# Patient Record
Sex: Male | Born: 1940 | Race: White | Hispanic: No | Marital: Married
Health system: Midwestern US, Academic
[De-identification: ages and names within clinical notes are randomized; demographics above are authoritative.]

---

## 2022-02-22 ENCOUNTER — Encounter: Admit: 2022-02-22 | Discharge: 2022-02-22 | Payer: MEDICARE

## 2022-02-22 ENCOUNTER — Ambulatory Visit: Admit: 2022-02-22 | Discharge: 2022-02-22 | Payer: MEDICARE

## 2022-02-22 DIAGNOSIS — R011 Cardiac murmur, unspecified: Secondary | ICD-10-CM

## 2022-02-23 ENCOUNTER — Encounter: Admit: 2022-02-23 | Discharge: 2022-02-23 | Payer: MEDICARE

## 2022-02-24 ENCOUNTER — Encounter: Admit: 2022-02-24 | Discharge: 2022-02-24 | Payer: MEDICARE

## 2022-02-24 NOTE — Telephone Encounter
02-24-2022 Per Task Message, request faxed to Dr Erskine Emery,   (F) [phone], clp      PCP, Dr. Barrie Folk, [facility], [address] [place], [place] [address], [phone]

## 2022-03-04 ENCOUNTER — Encounter: Admit: 2022-03-04 | Discharge: 2022-03-04 | Payer: MEDICARE

## 2022-03-04 DIAGNOSIS — R011 Cardiac murmur, unspecified: Secondary | ICD-10-CM

## 2022-03-10 ENCOUNTER — Ambulatory Visit: Admit: 2022-03-10 | Discharge: 2022-03-11 | Payer: MEDICARE

## 2022-03-10 ENCOUNTER — Encounter: Admit: 2022-03-10 | Discharge: 2022-03-10 | Payer: MEDICARE

## 2022-03-10 DIAGNOSIS — R55 Syncope and collapse: Secondary | ICD-10-CM

## 2022-03-10 DIAGNOSIS — E1121 Type 2 diabetes mellitus with diabetic nephropathy: Secondary | ICD-10-CM

## 2022-03-10 DIAGNOSIS — R011 Cardiac murmur, unspecified: Secondary | ICD-10-CM

## 2022-03-10 DIAGNOSIS — I359 Nonrheumatic aortic valve disorder, unspecified: Secondary | ICD-10-CM

## 2022-03-10 MED ORDER — HYDROCHLOROTHIAZIDE 25 MG PO TAB
25 mg | ORAL_TABLET | Freq: Every day | ORAL | 3 refills | 28.00000 days | Status: AC
Start: 2022-03-10 — End: ?

## 2022-03-10 NOTE — Progress Notes
Date of Service: 03/10/2022    Eric Barnes is a 81 y.o. male.       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  He is not having chest pain, palpitations, change in activity tolerance or shortness of breath.  Reports a few months ago was sitting down and got up to answer the phone fairly quickly and slouch against the door and went down.  By the time his wife got to him he was already awake and she helped him up without further sequela.  Has not reoccurred.  Really did not have any warning and does not recall much of the event.  Was fully neurologically intact both prior and after the event.  He was hospitalized in early June of this year because of cough that was found to be pneumonia.  He was hospitalized for 2 days without complications of recovery.  And continues to do well.  Notes he has gained a few pounds but denies orthopnea or PND type symptoms.  Has not had any recurrent syncope or near syncopal type symptoms since that time.  Denies significant neuropathy or balance difficulties.  Family history is positive for diabetes mellitus in multiple members.  His mother died at 89 and had been a significant diabetic.  His father lived to the age of 78.  Has a brother who is passed away but has a sister is still living.  Echocardiogram was performed that showed preserved left ventricular ejection fraction with no significant right heart abnormalities or pulmonary hypertension that was identified.  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  The study notes that the aortic valve was poorly visualized and there were concerns that may be pressure and velocity measurements were suboptimal.         Vitals: 03/10/22 0837   BP: 130/76   BP Source: Arm, Left Upper   Pulse: 73   SpO2: 96%   O2 Device: None (Room air)   PainSc: Zero   Weight: 93.6 kg (206 lb 6.4 oz)   Height: 175 cm (5' 8.9)     Body mass index is 30.57 kg/m?Marland Kitchen     Past Medical History  Patient Active Problem List    Diagnosis Date Noted   ? CKD (chronic kidney disease) stage 3, GFR 30-59 ml/min (HCC) 03/04/2022   ? Diabetes type 2, controlled (HCC) 03/04/2022   ? Obesity 03/04/2022   ? Heart murmur 03/04/2022   ? Hypertension 09/21/2011   ? Hyperlipidemia 09/21/2011         Review of Systems   Constitutional: Negative.   HENT: Negative.    Eyes: Negative.    Cardiovascular: Positive for syncope.   Respiratory: Negative.    Endocrine: Negative.    Hematologic/Lymphatic: Negative.    Skin: Negative.    Musculoskeletal: Negative.    Gastrointestinal: Negative.    Genitourinary: Negative.    Psychiatric/Behavioral: Negative.    Allergic/Immunologic: Negative.        Physical Exam  Awake and alert, well-nourished elderly man.  In no distress.  Accompanied by his wife.  Pupils are equal and reactive without scleral injection  Neck is supple with normal carotid upstroke and no  bruits.  I have a faint appreciation of radiation of his murmur into his neck.  Jugular venous distention set about 10 cm is fairly persistent with and without hepatic compression  Chest is symmetric and lungs are clear to auscultation with good effort no focal crackles or wheezes  Heart S1, S2 that are both preserved he does have an early systolic murmur 3/6 there is heard pretty much throughout the precordium and fairly stable with Valsalva and hand grasp maneuvers  Abdomen is protuberant but otherwise soft and nontender with positive bowel sounds and no bruits  Did not appears organomegaly but is a tough examination  Pulses are 2+, regular, and symmetric bilaterally radial as well as pedal location  Minimal edema just at the ankles with no obvious skin abnormalities.  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   02/22/22 30.04 kg/m?      Smoking Social History     Tobacco Use   Smoking Status Never   Smokeless Tobacco Current   ? Types: Chew      Lipid Profile Cholesterol   Date Value Ref Range Status   02/17/2022 210 (H) <200 Final     HDL   Date Value Ref Range Status   02/17/2022 38 (L) >=40 Final     LDL   Date Value Ref Range Status   02/17/2022 125 (H) <100 Final     Triglycerides   Date Value Ref Range Status   02/17/2022 237 (H) <150 Final      Blood Sugar No results found for: HGBA1C  Glucose   Date Value Ref Range Status   02/17/2022 133 (H) 70 - 105 Final   02/07/2022 235 (H) 70 - 105 Final          Problems Addressed Today  Encounter Diagnoses   Name Primary?   ? Aortic valve disorder Yes   ? Syncope, unspecified syncope type    ? Type II diabetes mellitus with nephropathy (HCC)        Assessment and Plan     Syncope was multifactorial.  With recognition of the sclerotic changes to his aortic valve with the history of hypertension, diabetes, and his age he likely has a fair amount of atherosclerotic changes with his vascular beds.  Was somewhat of a orthostatic type phenomenon but nothing that is problematic.  Has been backed away from some of his blood pressure medicine since his hospitalization because of lower blood pressure.  Renal function electrolytes have otherwise been stable.  Examination today does show hypervolemia and his blood pressure stable.  I am going to reinitiate his hydrochlorothiazide at 25 mg p.o. daily.  He can contact my office any question concerns otherwise follow-up with his  primary physician regarding response from a hemodynamic standpoint as well as his renal status.  Continue with the rosuvastatin and other risk factor modification for his risk for cardiovascular events going forward.  Based upon his clinical history, exam, and data available.  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  At this time would not perform further testing and just reassess routinely in approximately 3 years by echocardiogram.  If he is having change in symptoms or findings would reassess sooner.  Does discuss with me that he is having issues with constipation.  Discussed using skin fruits as well as over-the-counter MiraLAX or combination docusate/senna.  Noting that usually he requires a higher doses and titrated down to get good early control.  Follow-up otherwise can be as needed with me and routinely with his primary physician.         Current Medications (including today's revisions)  ? aspirin EC 81 mg tablet Take one tablet by mouth daily.   ? canagliflozin (INVOKANA) 100 mg tablet Take one tablet by mouth daily with breakfast.   ? hydroCHLOROthiazide (HYDRODIURIL) 25 mg tablet Take one tablet by mouth daily.   ? losartan (COZAAR) 100 mg tablet Take one tablet by mouth daily.   ? LUMIGAN 0.01 % drop Apply one drop to both eyes at bedtime daily.   ? metFORMIN-XR (GLUCOPHAGE XR) 500 mg extended release tablet Take three tablets by mouth daily.   ? pioglitazone (ACTOS) 15 mg tablet Take one tablet by mouth daily.   ? rosuvastatin (CRESTOR) 10 mg tablet Take one tablet by mouth daily.   ? timoloL maleate (TIMOPTIC) 0.5 % ophthalmic drops Apply one drop to two drops to both eyes every morning.   ? timolol maleate XE (TIMOPTIC-XE) 0.5 % ophthalmic gel Apply one drop to right eye as directed daily.

## 2023-03-09 ENCOUNTER — Encounter: Admit: 2023-03-09 | Discharge: 2023-03-09 | Payer: MEDICARE

## 2023-03-09 MED ORDER — HYDROCHLOROTHIAZIDE 25 MG PO TAB
25 mg | ORAL_TABLET | Freq: Every day | ORAL | 3 refills
Start: 2023-03-09 — End: ?

## 2023-03-10 ENCOUNTER — Encounter: Admit: 2023-03-10 | Discharge: 2023-03-10 | Payer: MEDICARE

## 2023-04-01 ENCOUNTER — Encounter: Admit: 2023-04-01 | Discharge: 2023-04-01 | Payer: MEDICARE

## 2023-04-01 MED ORDER — HYDROCHLOROTHIAZIDE 25 MG PO TAB
25 mg | ORAL_TABLET | Freq: Every day | ORAL | 1 refills
Start: 2023-04-01 — End: ?

## 2023-05-26 IMAGING — CT C_SPINE_(Adult)
3 of 5 series · 11 of 33 positions shown, 13 images · non-contrast
Comparison: none

[Series 4: c-spine cor 2.00 br60 s3 · coronal · 0.25mm/px · 3 of 66 slices shown]
[im 14/66  bone]
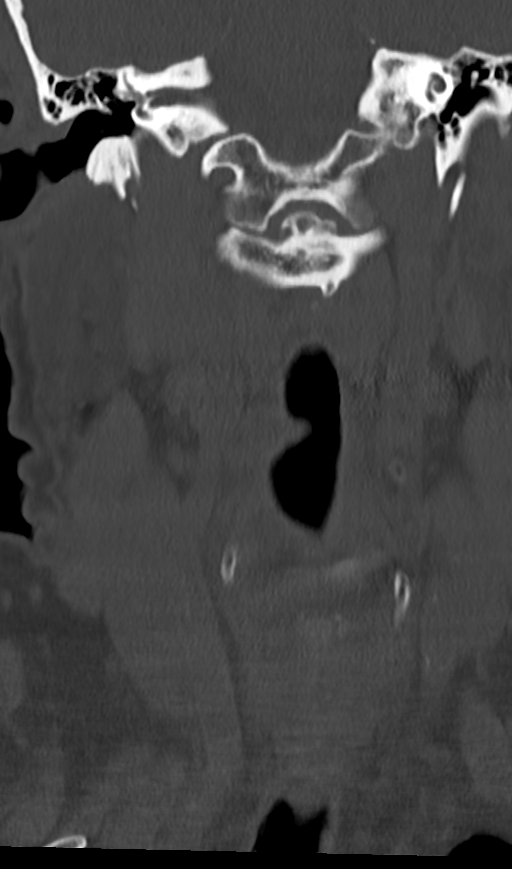
[im 27/66  bone]
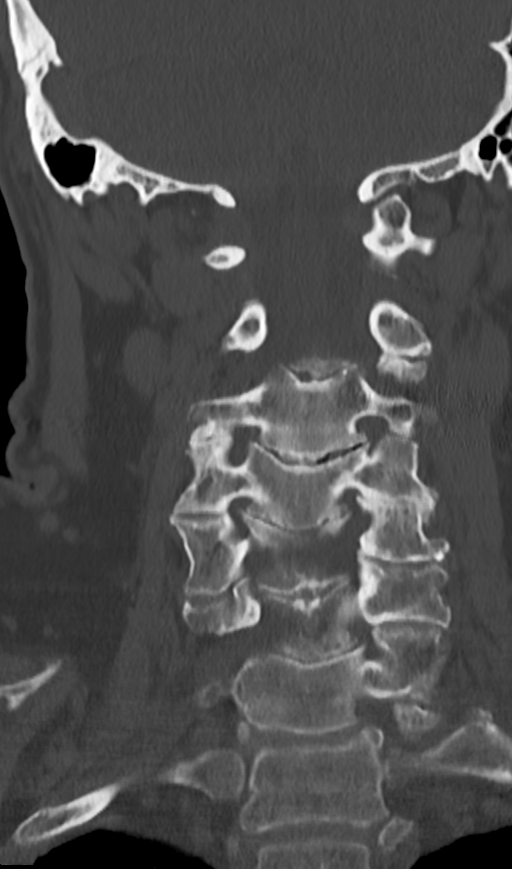
[im 40/66  bone]
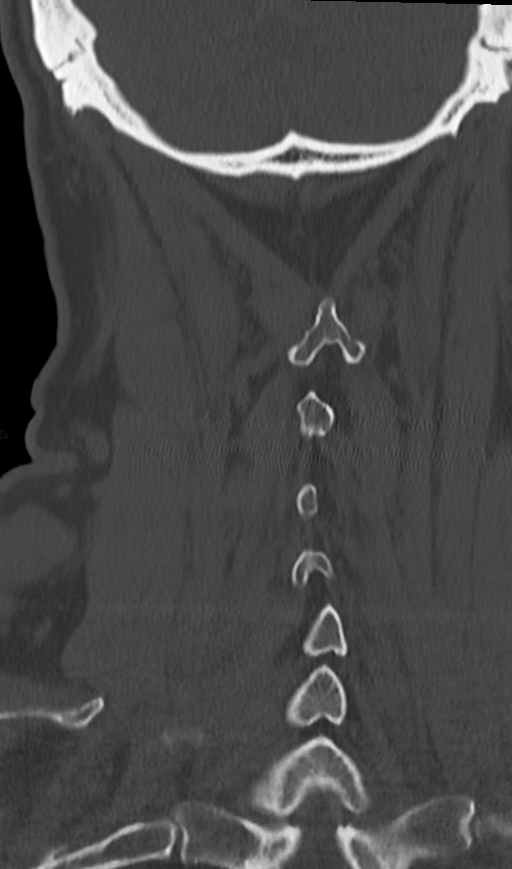

[Series 6: c-spine sag 2.00 br60 s3 · sagittal · 0.27mm/px · 5 of 64 slices shown, 6 images]
[im 22/64  bone]
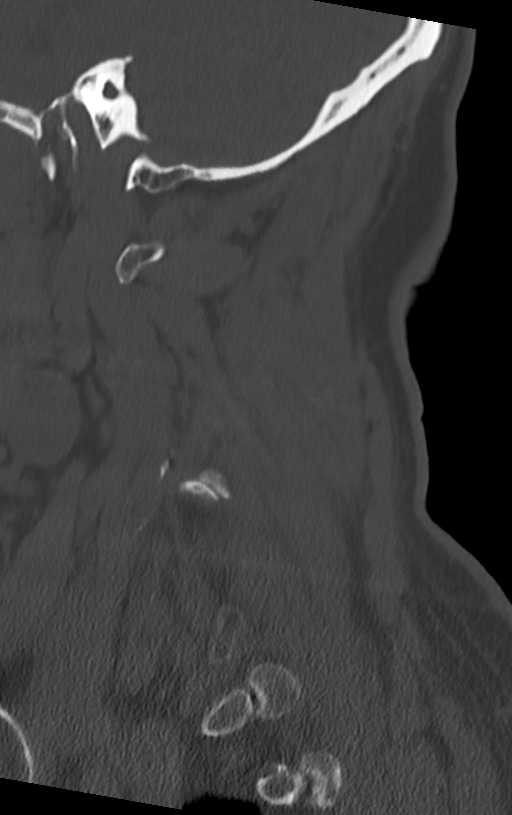
[im 27/64  bone]
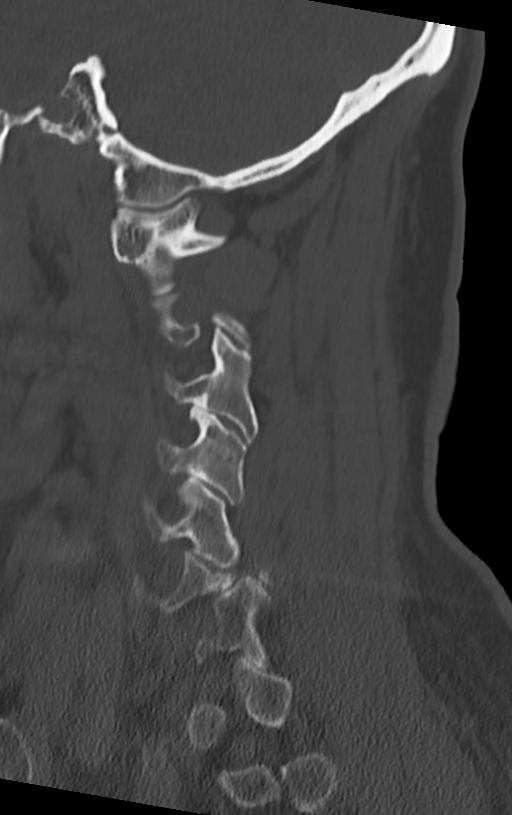
[im 32/64  soft-tissue]
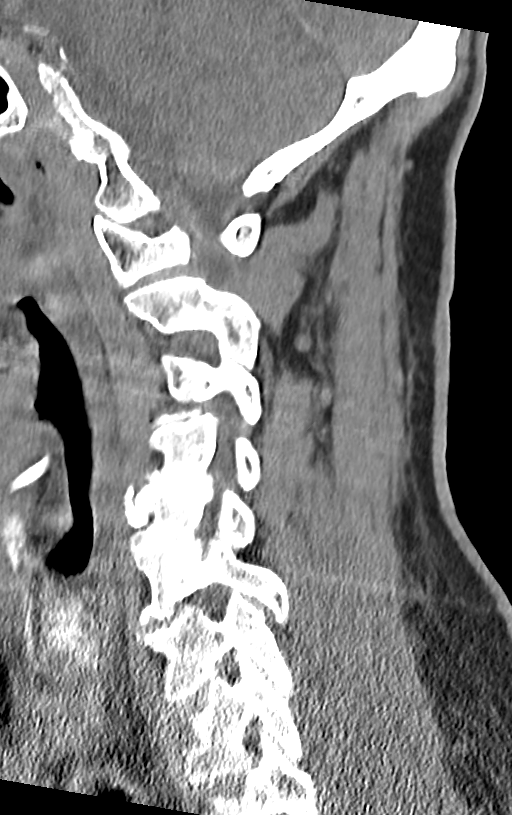
[im 32/64  bone]
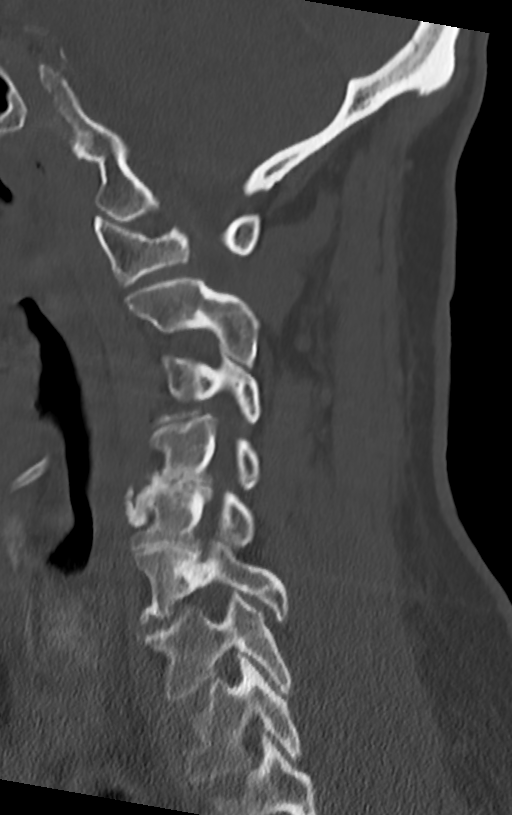
[im 37/64  bone]
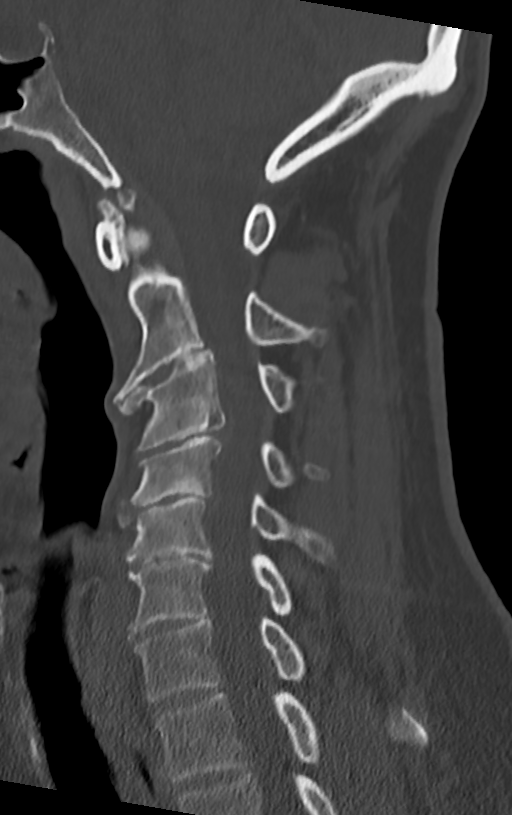
[im 43/64  bone]
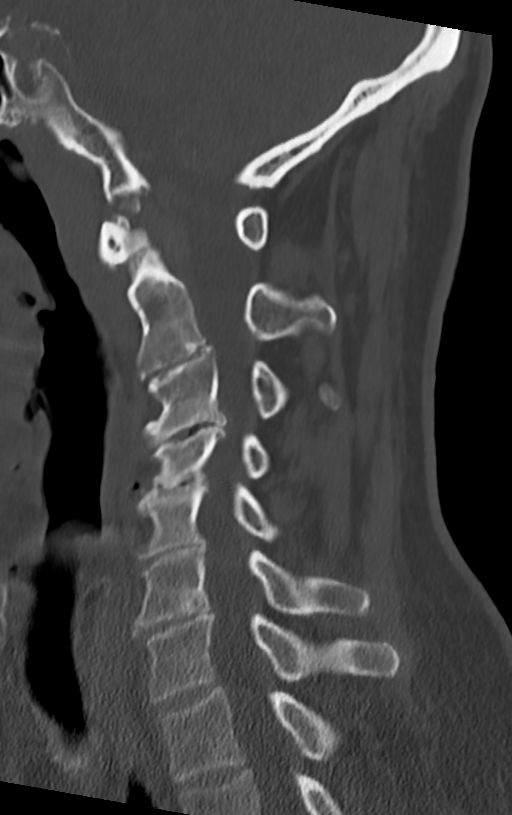

[Series 11: c-spine ax 1.00 br40 s3 · axial · 0.28mm/px · z∈[-755,-645]mm · 3 of 165 slices shown, 4 images]
[im 33/165  soft-tissue]
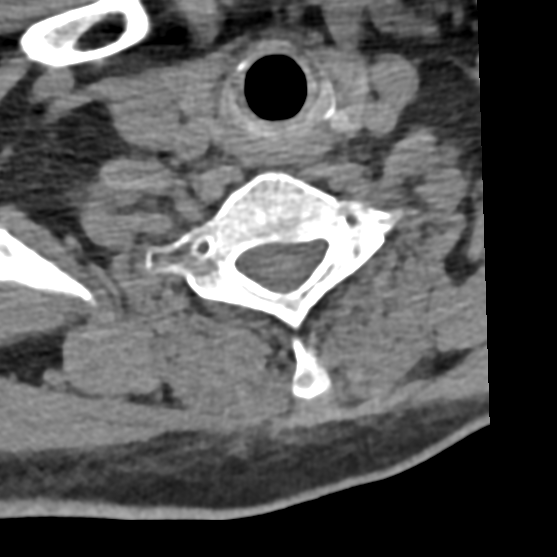
[im 33/165  bone]
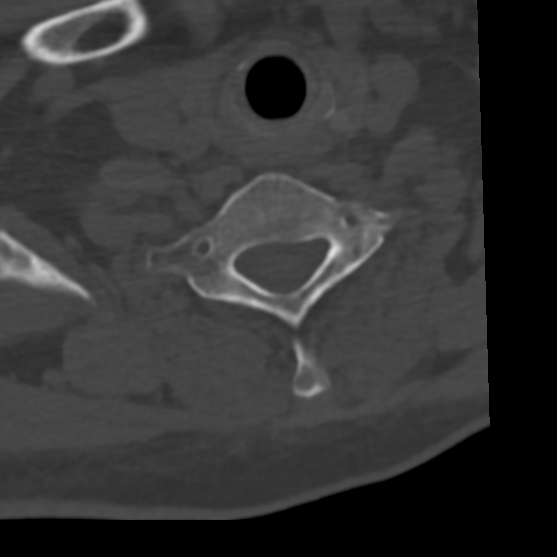
[im 99/165  bone]
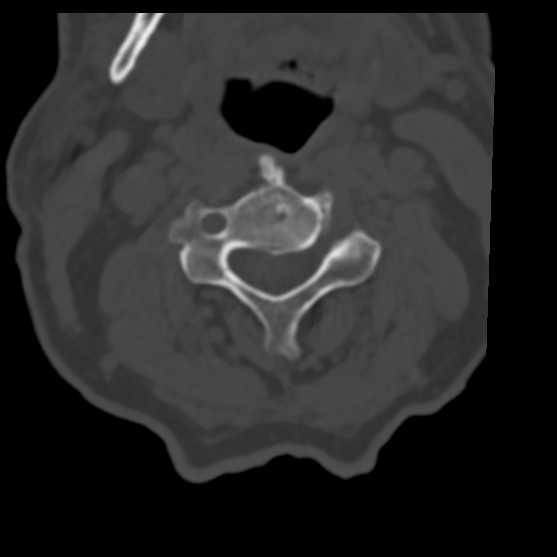
[im 132/165  bone]
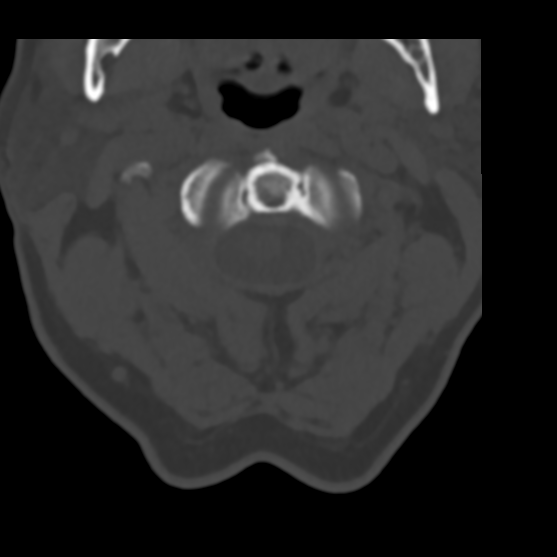

[11 of 33 positions shown; findings below may reference images not displayed]

EXAM

CT cervical spine wo con

INDICATION

fall, hit meds, now with neck pain
Neck pain after falling and hitting his head. CT/NM 0/0

TECHNIQUE

CT CERVICAL SPINE WITHOUT CONTRAST. Volumetric multi detector CT images of the chest were obtained
after the uneventful administration of [100] mL of Omnipaque 300 intravenous contrast. All CT scans
at this facility use dose modulation, iterative reconstruction, and/or weight based dosing when
appropriate to reduce radiation dose to as low as reasonably achievable.

# of previous computed tomography exams in the last 12 months = 0 # of previous myocardial perfusion
studies in the last 12 months = 0

COMPARISONS

None available at the time of dictation.

FINDINGS

There is no significant paravertebral soft tissue swelling. There are degenerative changes
throughout the cervical spine. There is no evidence of acute fracture or dislocation.

IMPRESSION
1. No acute traumatic findings.

Tech Notes:

Neck pain after falling and hitting his head. CT/NM 0/0

## 2023-06-10 IMAGING — US [ID]
1 series · 12 of 16 positions shown · non-contrast
Comparison: none

[Series 1: us echo 2d limited · 101 acquisitions, 12 frames shown]
[im 1/101]
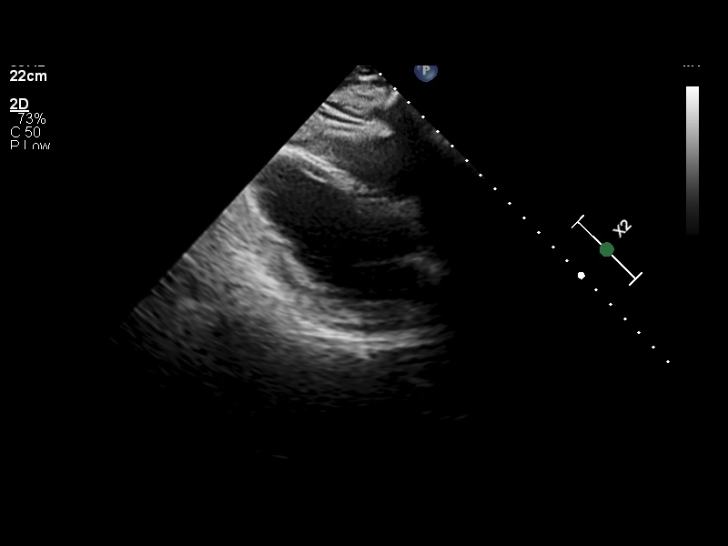
[im 14/101]
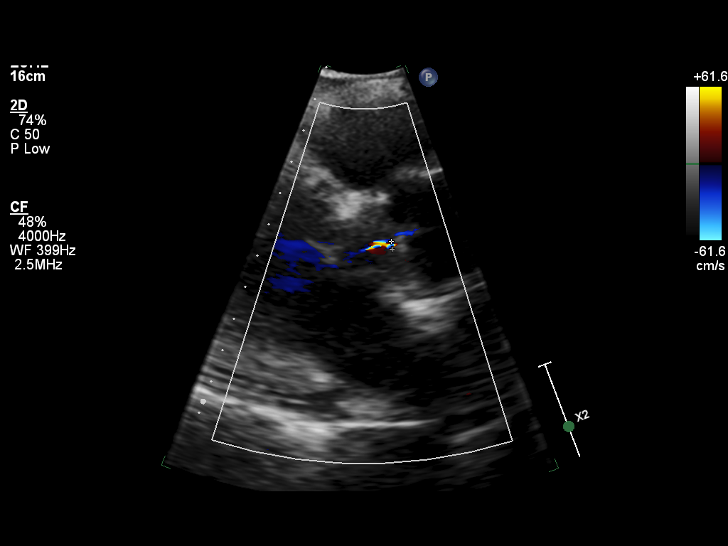
[im 21/101]
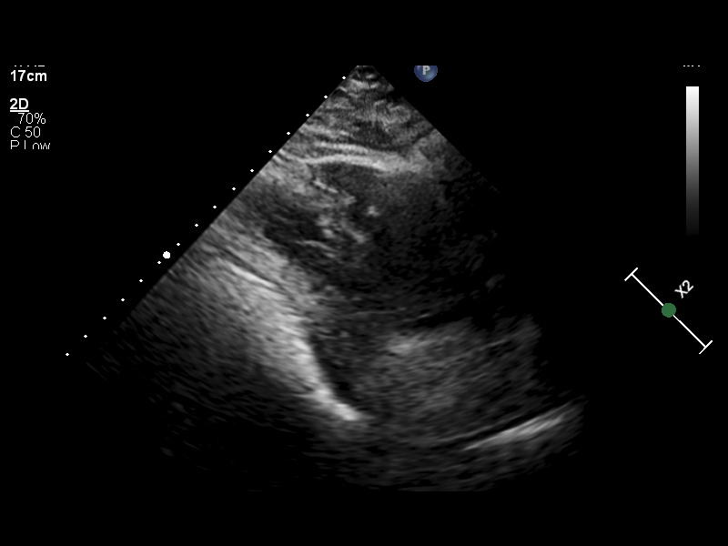
[im 27/101]
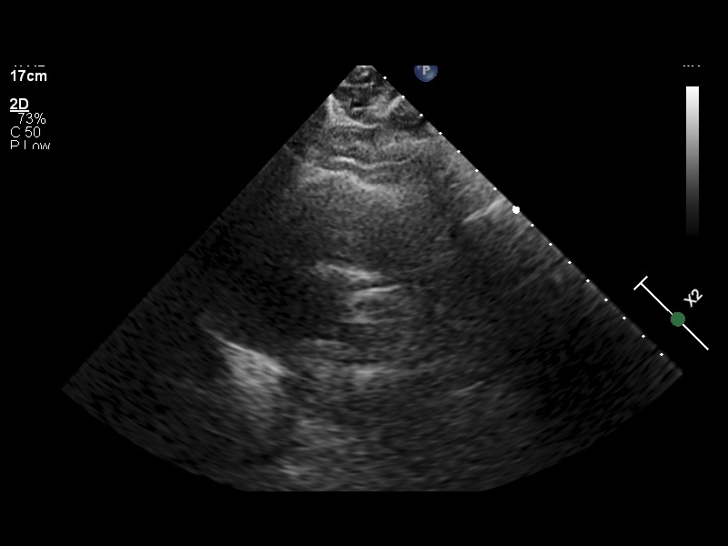
[im 41/101]
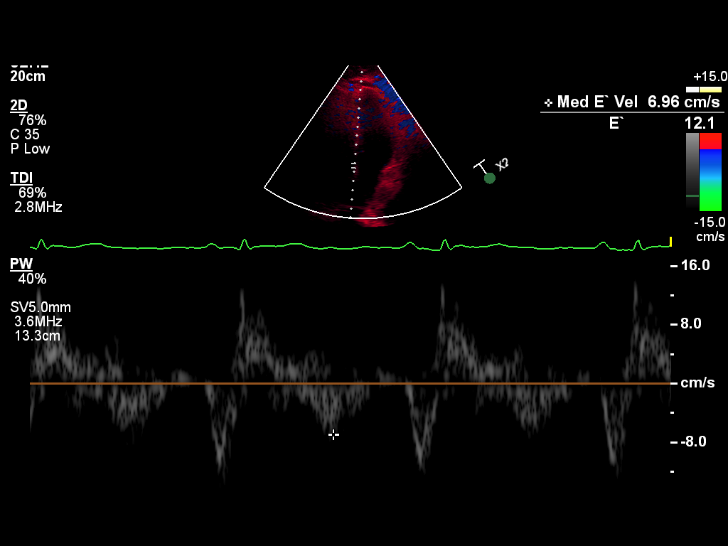
[im 47/101]
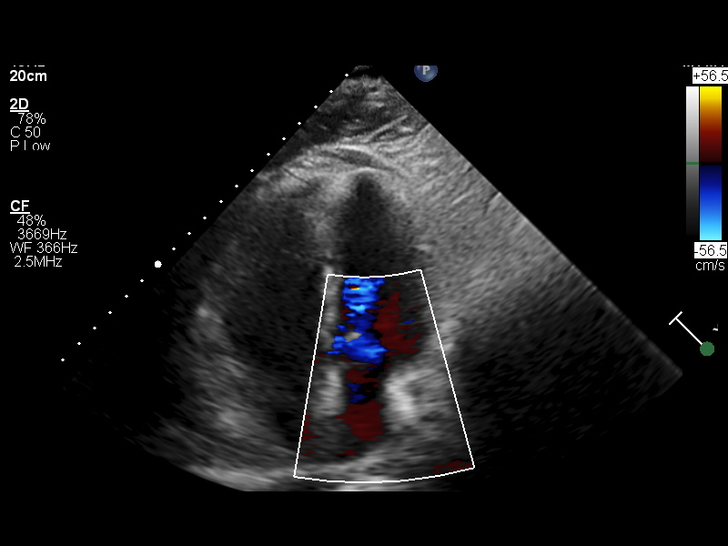
[im 54/101]
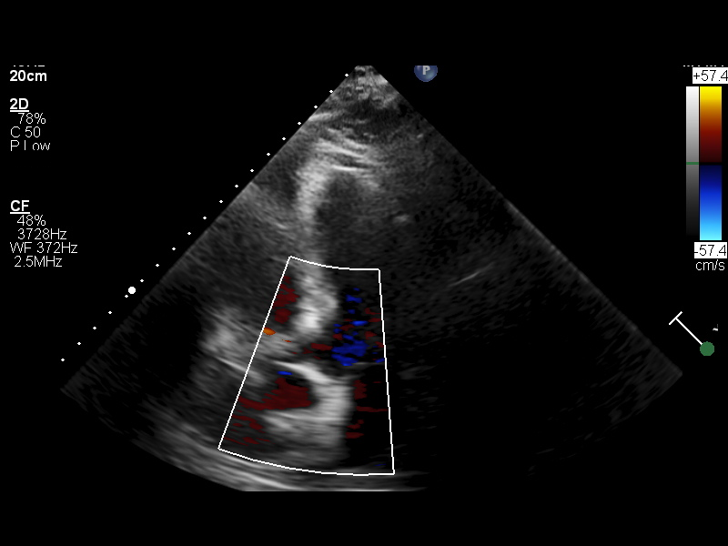
[im 67/101]
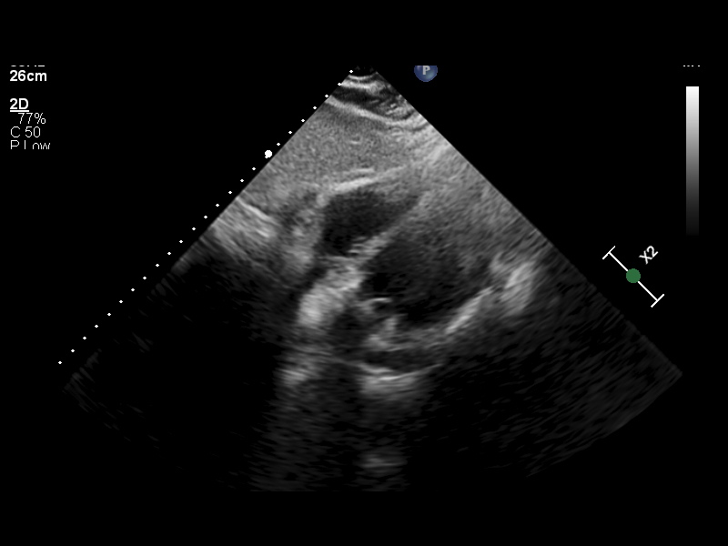
[im 74/101]
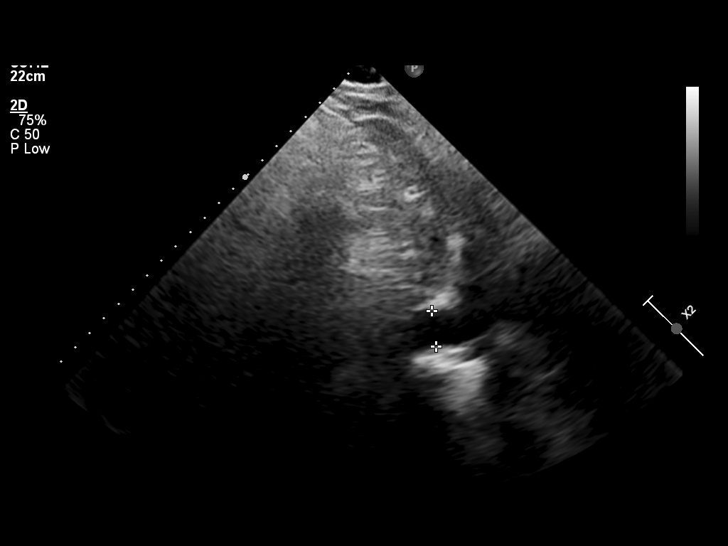
[im 81/101]
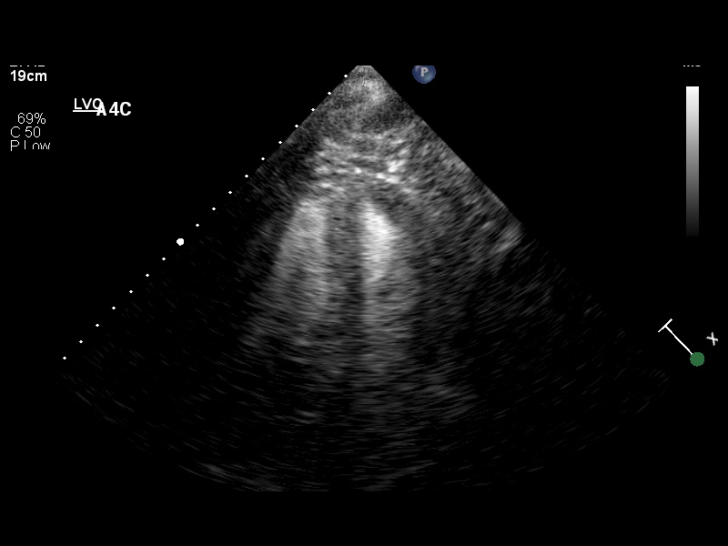
[im 94/101]
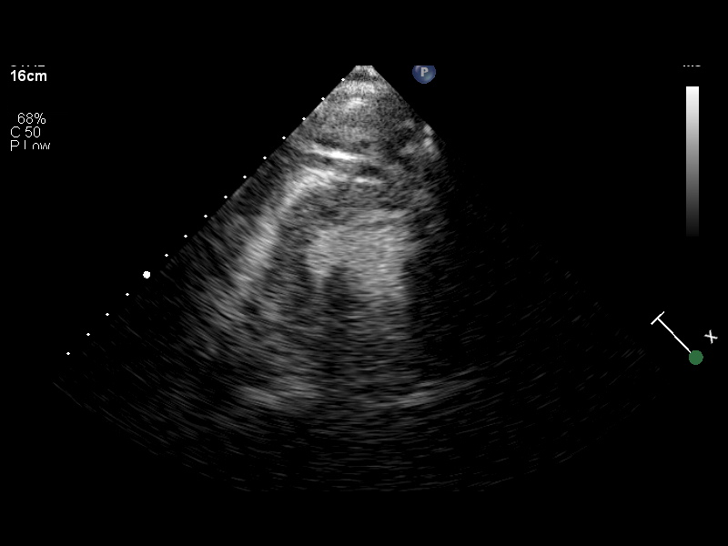
[im 101/101]
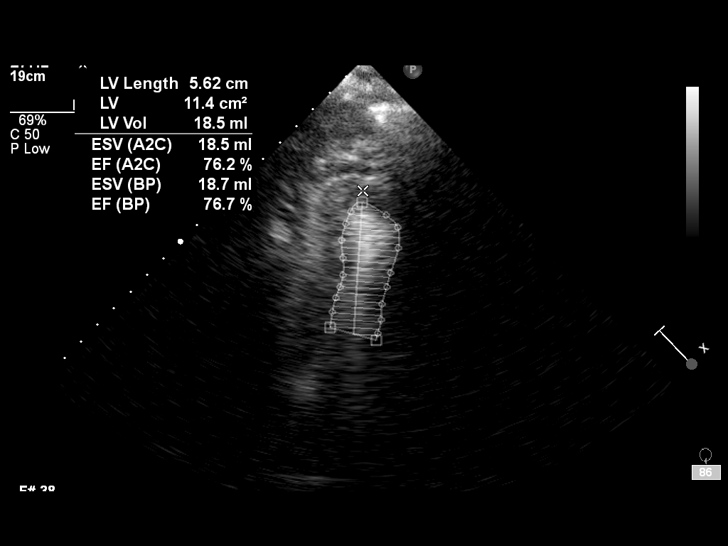

[12 of 16 positions shown; findings below may reference images not displayed]

02/22/22 -  2D + DOPPLER ECHO
Location Performed: [HOSPITAL]

Referring Provider:
Fellow:
Location of Interp:
Sonographer: External Staff

Indications: Murmur

Definity contrast was given to enhance imaging. Technically difficult study.

Vitals
Height   Weight   BSA (Calculated)   BP   Comments
175 cm (5' 8.9")   92 kg (202 lb 13.2 oz)   2.11   118/74

Interpretation Summary

The left ventricular systolic function is normal. The visually estimated ejection fraction is 65%.
There are no segmental wall motion abnormalities.
Normal left ventricular diastolic function. Normal left atrial pressure.
The right ventricular size, wall thickness and systolic function are normal.
The aortic valve is poorly visualized, it does appear to have significant restriction of the
systolic opening, very likely underestimated MG by this study.
The pulmonary artery pressure could not be estimated due to inadequate tricuspid regurgitation
signal.

I recommend further evaluation with the repeat, limited 2D echo Doppler study with focus on the
aortic valve for mean gradient evaluation or perhaps even consideration of KANABE for direct
visualization and measurement of the aortic valve area by planimetry.

No previous study available for comparison.

Echocardiographic Findings
Left Ventricle   The left ventricular size is normal. Wall thickness is increased. Concentric
remodeling. The left ventricular systolic function is normal. The visually estimated ejection
fraction is 65%. There are no segmental wall motion abnormalities. Normal left ventricular diastolic
function. Normal left atrial pressure.
Right Ventricle   The right ventricular size, wall thickness and systolic function are normal. The
pulmonary artery pressure could not be estimated due to inadequate tricuspid regurgitation signal.
Left Atrium   Normal size.
Right Atrium   Not well seen.
IVC/SVC   Normal central venous pressure (0-5 mm Hg).
Mitral Valve   The mitral valve was not well seen. No stenosis. No regurgitation.
Tricuspid Valve   The tricuspid valve was not well seen. No stenosis. Trace regurgitation.
Aortic Valve   The aortic valve is poorly visualized, it does appear to have significant restriction
of the systolic opening, very likely underestimated MG by this study.
Pulmonary   The pulmonic valve was not seen well but no Doppler evidence of stenosis. No
regurgitation.
The pulmonary artery was not well seen.
Aorta   The ascending aorta is mildly dilated. The sinuses of Valsalva are mildly dilated.
Pericardium   No pericardial effusion.

Left Ventricular Wall Scoring
Resting   Score Index: 1.000   Percent Normal: 100.0%

The left ventricular wall motion is normal.

Left Heart 2D Measurements (Normal Ranges)
EF (Visual)
65 %
LVIDD
4.2 cm  (Range: 4.2 - 5.8)
LVIDS
1.6 cm  (Range: 2.5 - 4.0)
IVS
1.2 cm  (Range: 0.6 - 1.0)
LV PW
1.4 cm  (Range: 0.6 - 1.0)
LA Size
3.7 cm  (Range: 3.0 - 4.0)

Right Heart 2D   M-Mode Measurements (Normal Ranges) (Range)
M-Mode TAPSE
1.8 cm  (>1.7)

Left Heart 2D Addnl Measurements (Normal Ranges)
LV Systolic Vol
19 mL  (Range: 21 - 61)
LV Systolic Vol Index
9 mL/m2  (Range: 11 - 31)
LV Diastolic Vol
80 mL  (Range: 62 - 150)
LV Diastolic Vol Index
38 mL/m2  (Range: 34 - 74)
LA Vol
41 mL  (Range: 18 - 58)
LA Vol Index
19.43 mL/m2  (Range: 16 - 34)
LV Mass
201 g  (Range: 88 - 224)
LV Mass Index
95 g/m2  (Range: 49 - 115)
RWT
0.67  (Range: <=0.42)

Aortic Root Measurements (Normal Ranges)
Sinus
4.1 cm  (Range: 2.8 - 4.0)
PUTULLU NAGY HEGYESI
3.9 cm

Doppler (Spectral and Color Flow)
Aortic valve area
2.23 cm2
Aortic valve mean gradient
Aortic valve peak velocity
1.8 m/s
Aortic valve velocity ratio

Tech Notes:

Definity 6326. NKDA. JL
